# Patient Record
Sex: Female | Born: 2000
Health system: Southern US, Community
[De-identification: ages and names within clinical notes are randomized; demographics above are authoritative.]

## PROBLEM LIST (undated history)

## (undated) DIAGNOSIS — D649 Anemia, unspecified: Secondary | ICD-10-CM

---
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
## 2020-08-31 IMAGING — CR CHEST - 2 VIEW
2 series · 2 of 2 positions shown · non-contrast
Comparison: None.

CLINICAL DATA: 18-year-old female with intermittent chest pain for
several days and shortness of breath.

EXAM:
CHEST - 2 VIEW

[chest pa]
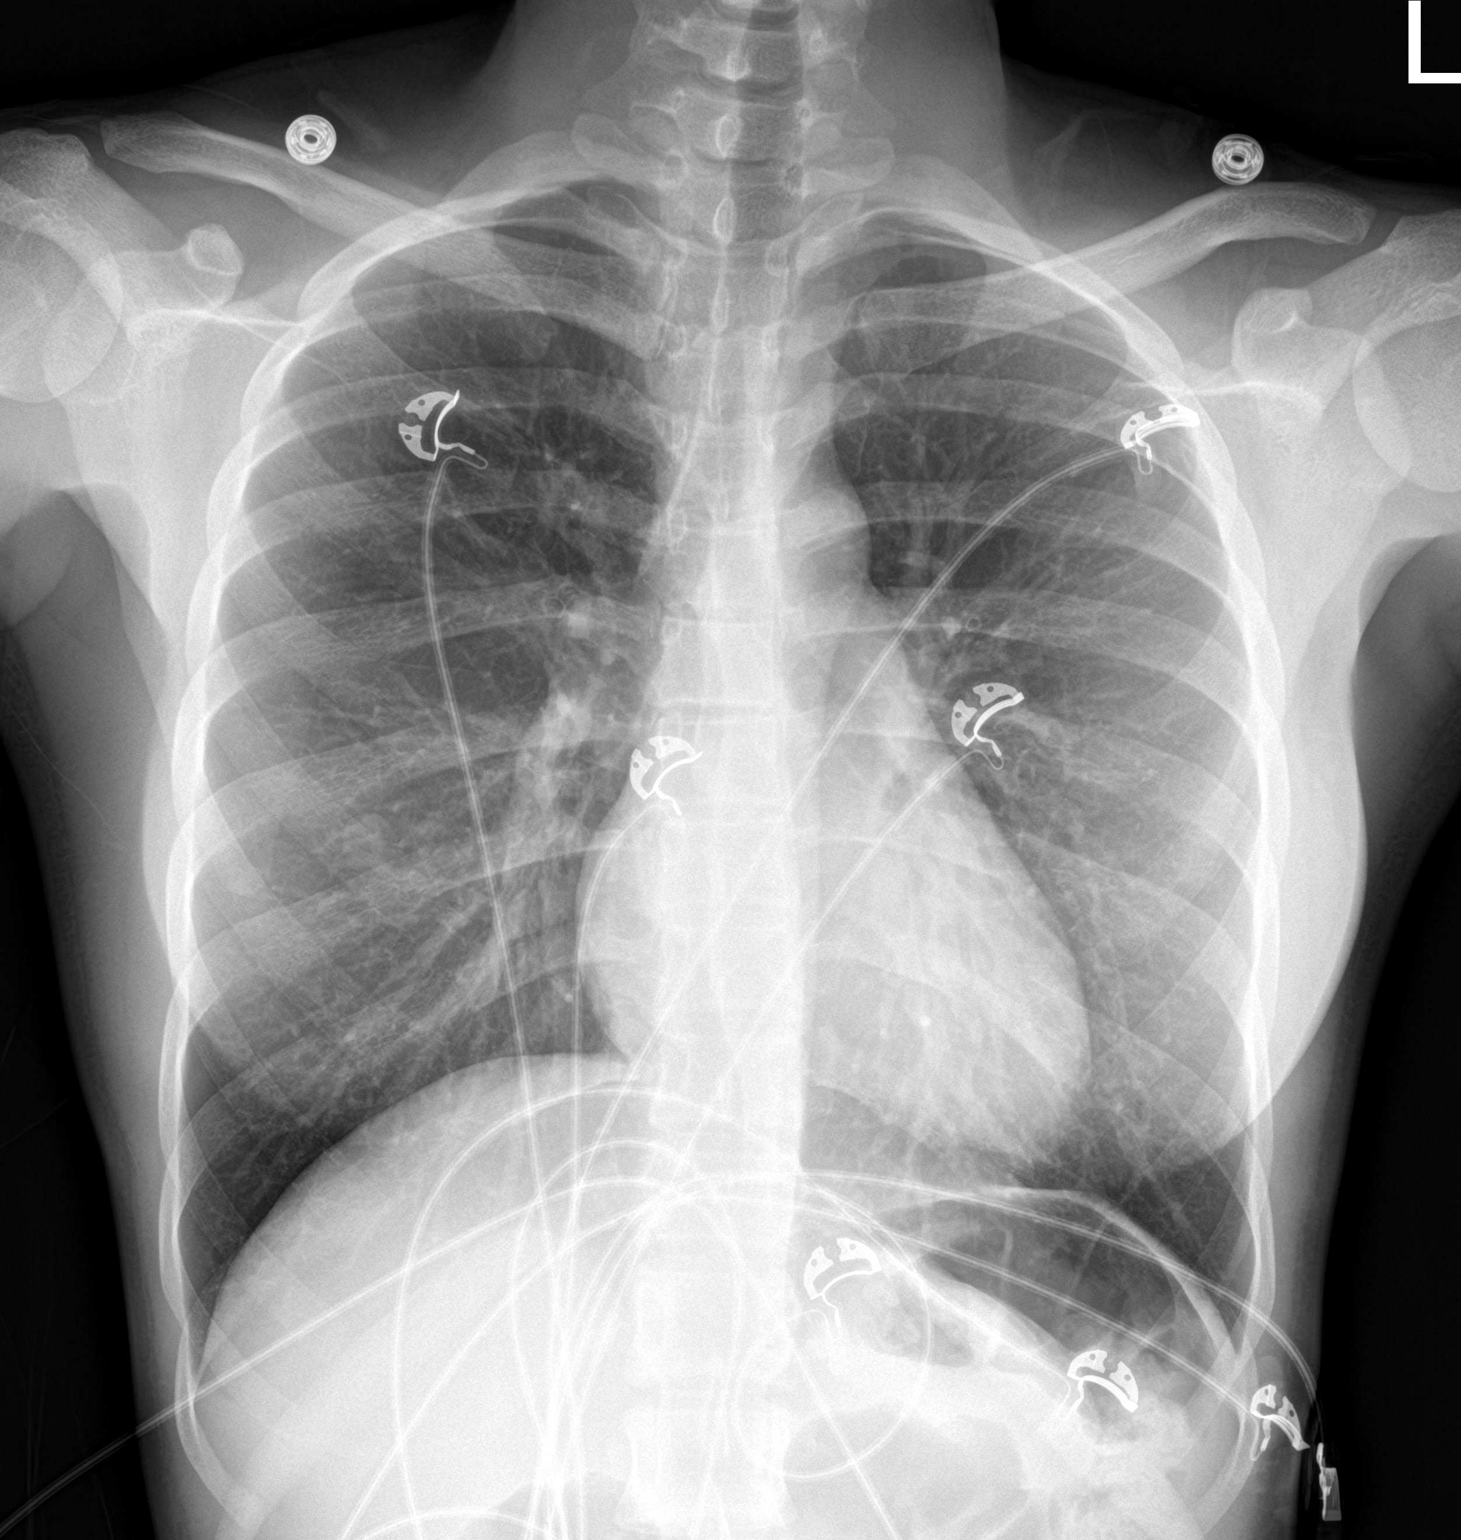

[chest lat]
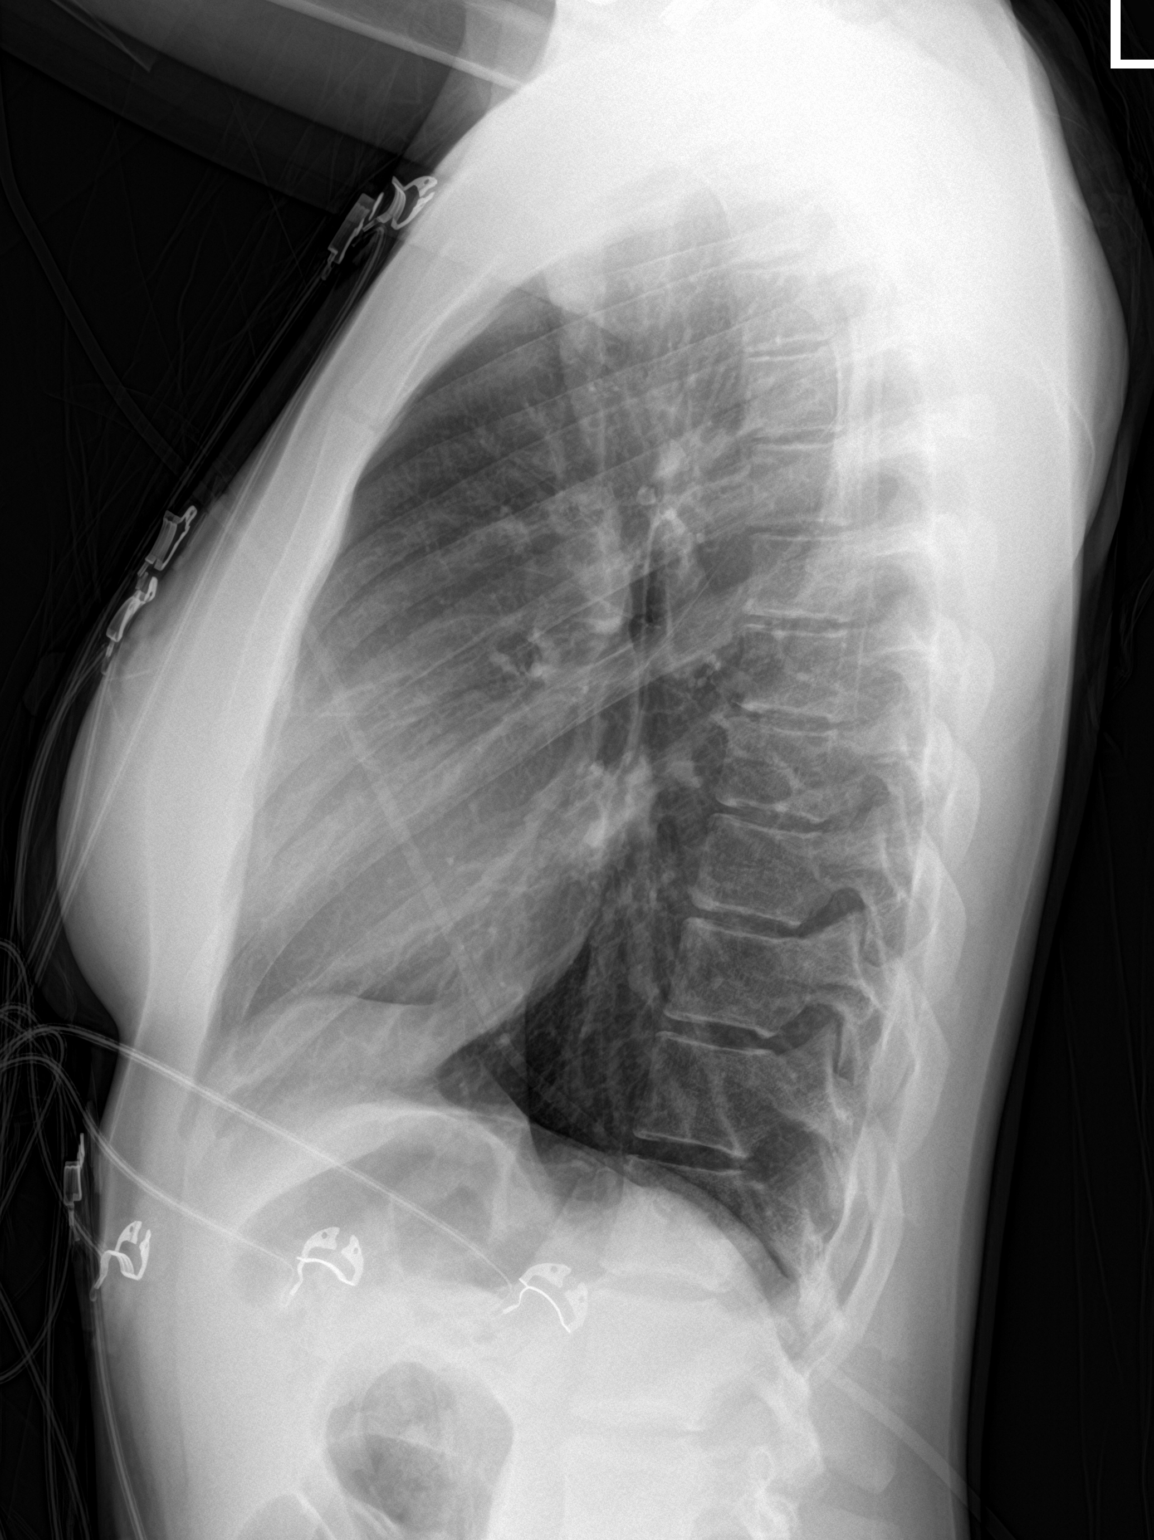

[2 of 2 positions shown; findings below may reference images not displayed]

FINDINGS: Lung volumes and mediastinal contours are normal. Visualized
tracheal air column is within normal limits. No pneumothorax,
pulmonary edema, pleural effusion or abnormal pulmonary opacity.
Incidental nipple shadows. Negative visible bowel gas and osseous
structures.
IMPRESSION: Negative.  No cardiopulmonary abnormality.

## 2020-12-08 IMAGING — US US PELVIS COMPLETE
1 series · 14 of 25 positions shown · non-contrast
Comparison: None.

CLINICAL DATA: Menorrhagia.

EXAM:
TRANSABDOMINAL ULTRASOUND OF PELVIS
TECHNIQUE: Transabdominal ultrasound examination of the pelvis was performed
including evaluation of the uterus, ovaries, adnexal regions, and
pelvic cul-de-sac.

[Series 1: us pelvis complete · 0.17mm/px · 14 of 49 slices shown]
[im 1/49]
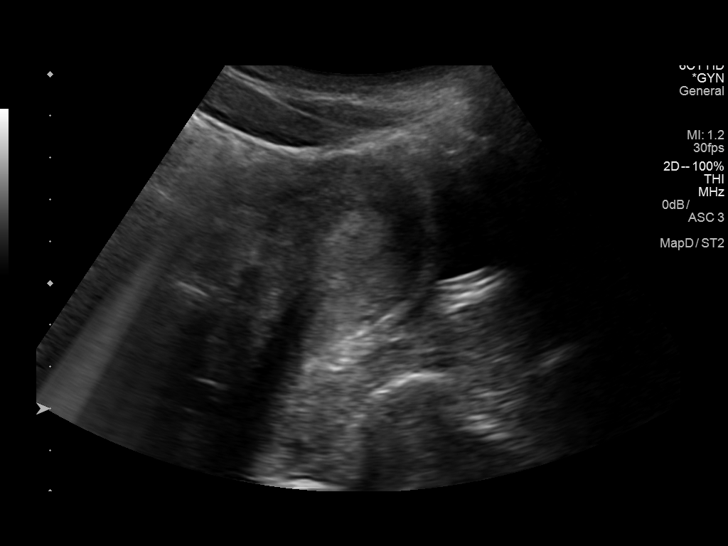
[im 5/49]
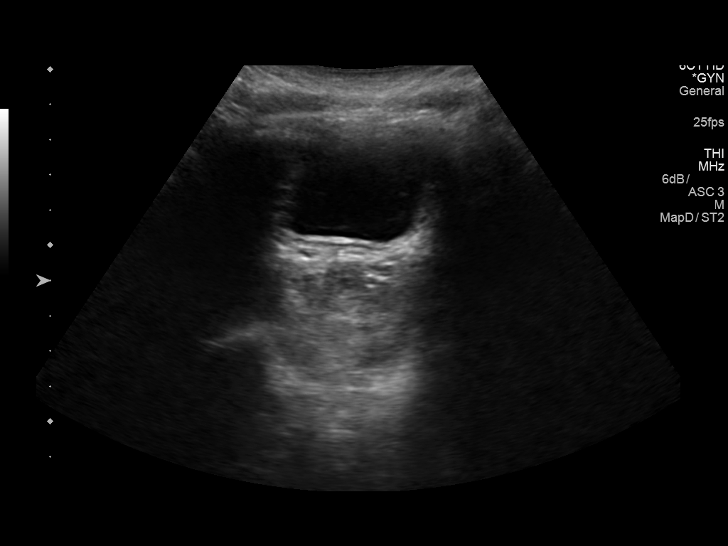
[im 9/49]
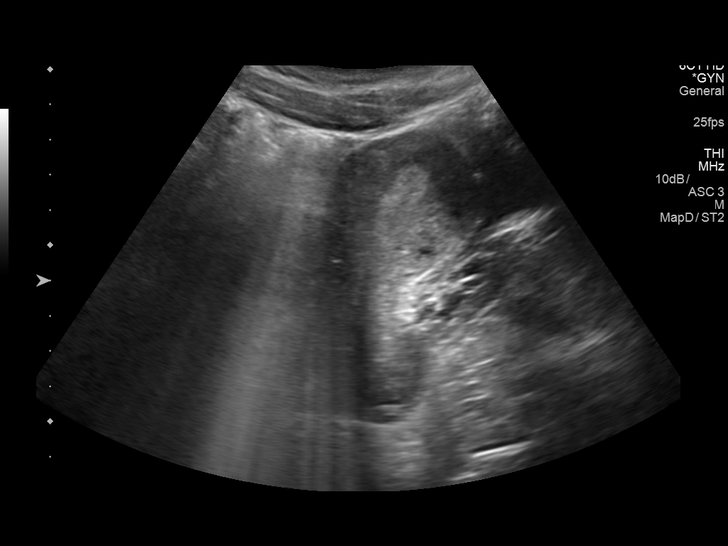
[im 13/49]
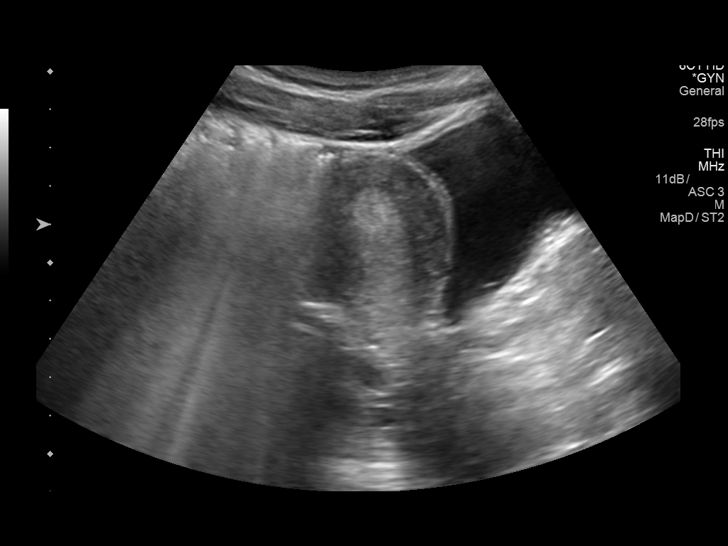
[im 17/49]
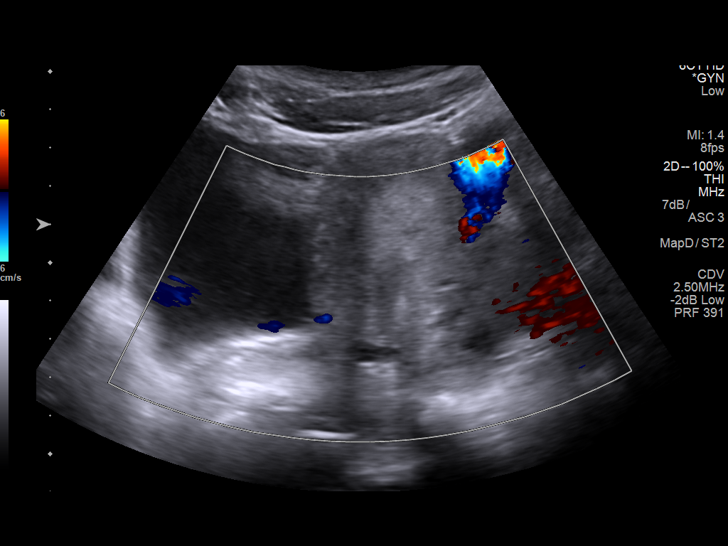
[im 19/49]
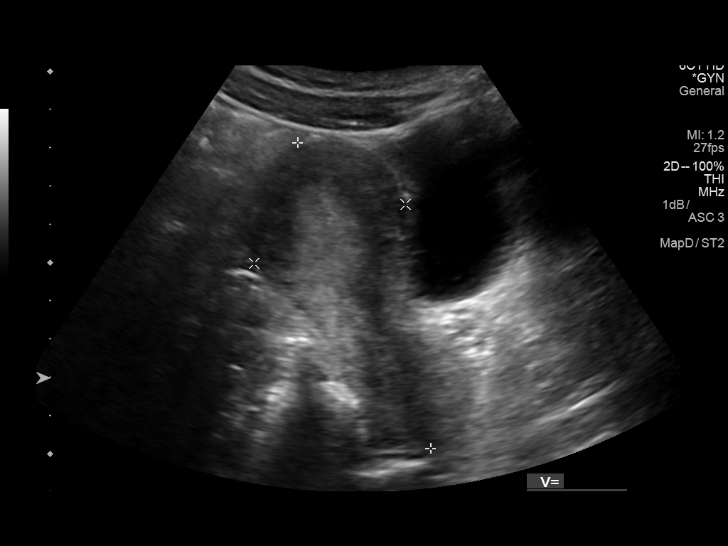
[im 23/49]
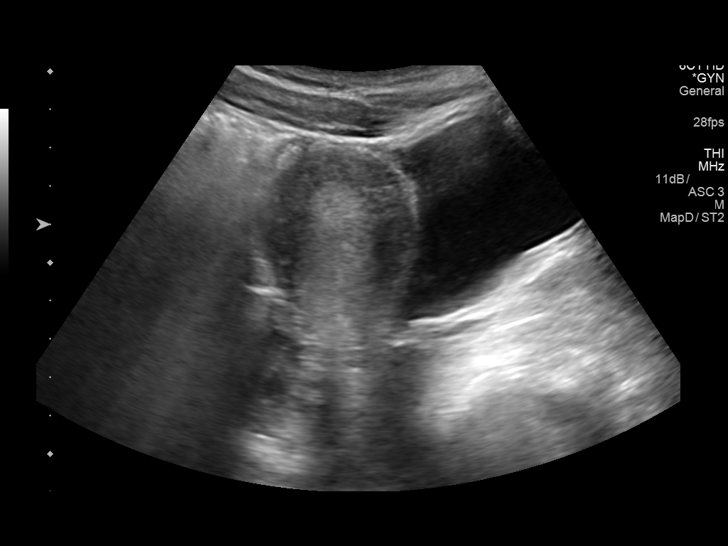
[im 27/49]
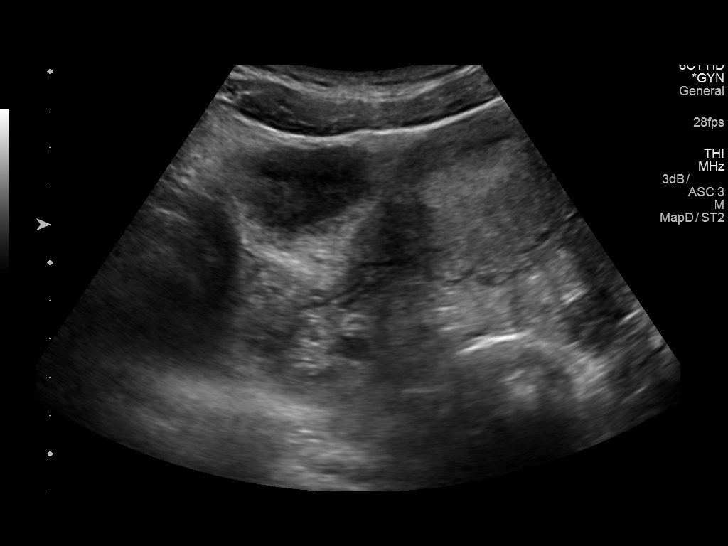
[im 31/49]
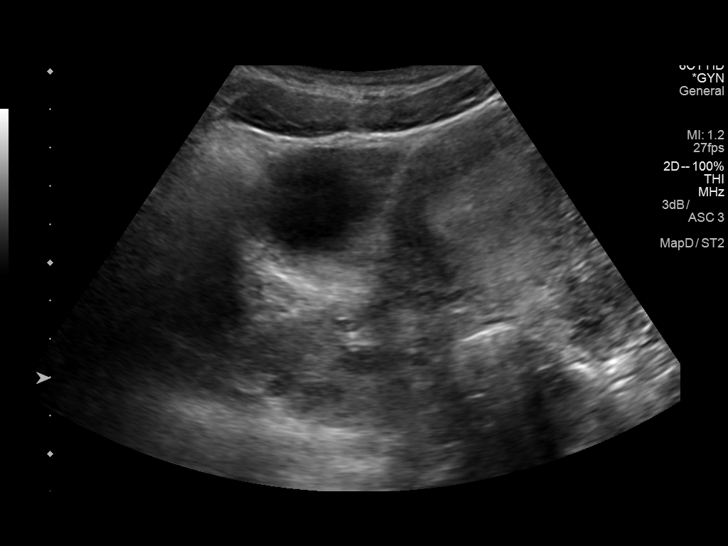
[im 33/49]
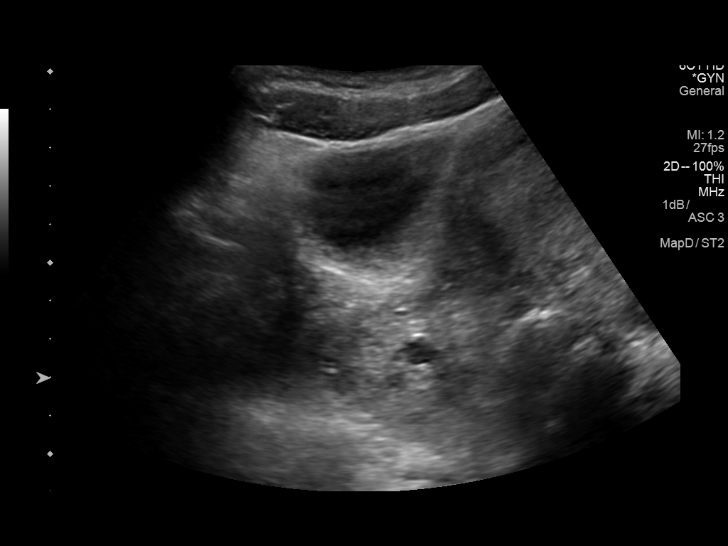
[im 37/49]
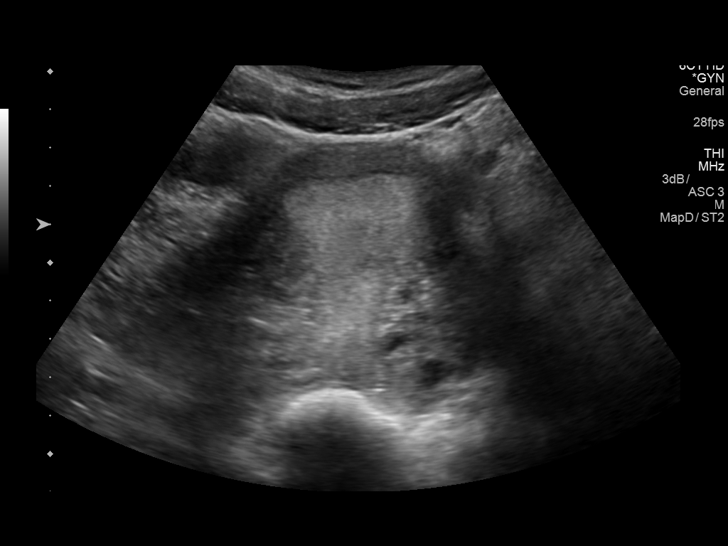
[im 41/49]
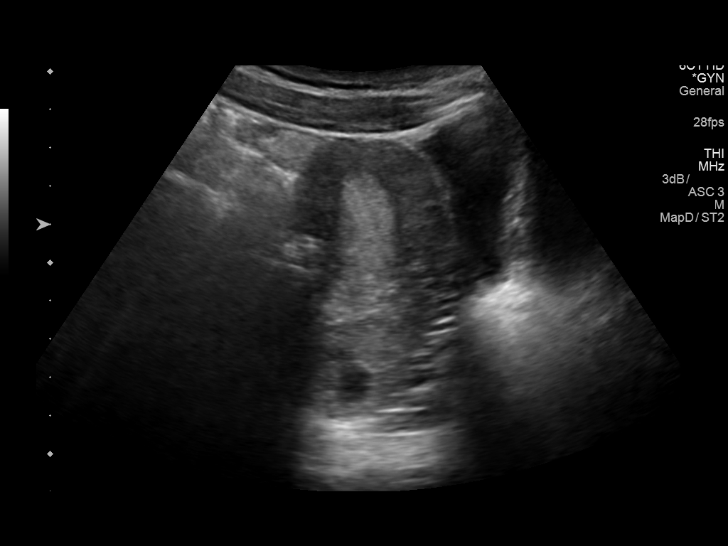
[im 45/49]
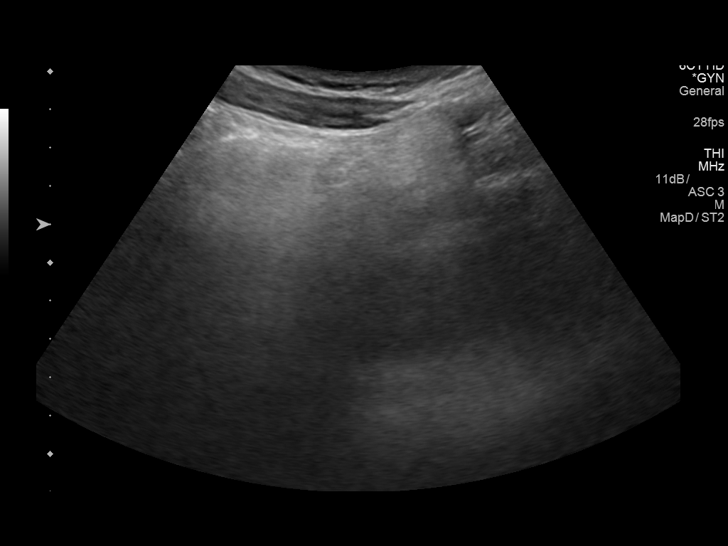
[im 49/49]
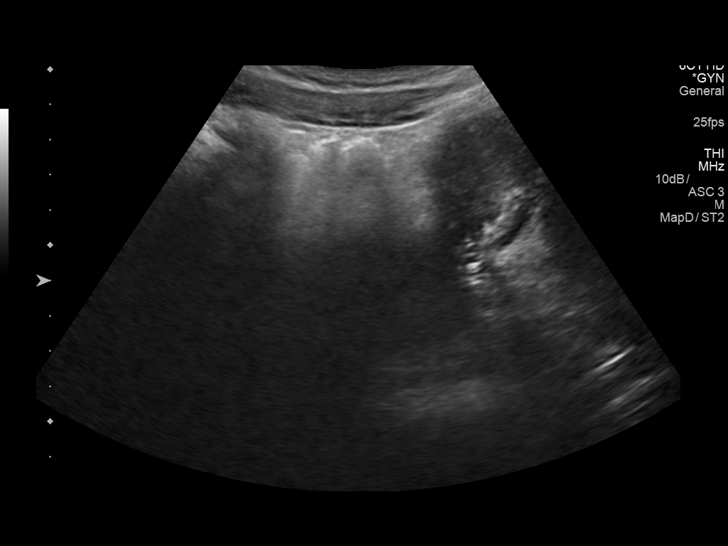

[14 of 25 positions shown; findings below may reference images not displayed]

FINDINGS: Uterus

Measurements: 8.7 x 4.3 x 5.6 cm. = volume: 108 mL. No fibroids or
other mass visualized.

Endometrium

Thickness: 15.5 mm.  No focal abnormality visualized.

Right ovary

Measurements: 2.8 x 1.9 x 3.5 cm = volume: 10 mL. Normal
appearance/no adnexal mass.

Left ovary

Measurements: 3.5 x 2.5 x 2.6 cm = volume: 12 mL. Normal
appearance/no adnexal mass.

Other findings:  No abnormal free fluid.
IMPRESSION: 1. No endometrial mass. The endometrial thickness is 15.5 mm. If
bleeding remains unresponsive to hormonal or medical therapy,
sonohysterogram should be considered for focal lesion work-up. (Ref:
Radiological Reasoning: Algorithmic Workup of Abnormal Vaginal
Bleeding with Endovaginal Sonography and Sonohysterography. AJR
0661; 191:S68-73)
2. No other abnormalities.
